# Patient Record
Sex: Female | Born: 1985 | Race: White | Hispanic: No | Marital: Single | Smoking: Former smoker
Health system: Southern US, Community
[De-identification: ages and names within clinical notes are randomized; demographics above are authoritative.]

## PROBLEM LIST (undated history)

## (undated) DIAGNOSIS — J45909 Unspecified asthma, uncomplicated: Secondary | ICD-10-CM

## (undated) DIAGNOSIS — Z91010 Allergy to peanuts: Secondary | ICD-10-CM

## (undated) DIAGNOSIS — G930 Cerebral cysts: Secondary | ICD-10-CM

## (undated) HISTORY — DX: Allergy to peanuts: Z91.010

## (undated) HISTORY — DX: Unspecified asthma, uncomplicated: J45.909

## (undated) HISTORY — DX: Cerebral cysts: G93.0

## (undated) HISTORY — PX: TONSILLECTOMY: SUR1361

## (undated) HISTORY — PX: APPENDECTOMY: SHX54

## (undated) HISTORY — PX: WISDOM TOOTH EXTRACTION: SHX21

---

## 2007-06-19 ENCOUNTER — Emergency Department (HOSPITAL_COMMUNITY): Admission: EM | Admit: 2007-06-19 | Discharge: 2007-06-19 | Payer: Self-pay | Admitting: Emergency Medicine

## 2008-01-13 IMAGING — CR DG SACRUM/COCCYX 2+V
3 series · 3 of 3 positions shown · non-contrast
Comparison: None.

CLINICAL DATA: 20-year-old female, status post trauma, fall.  Pain to the sacrum and coccyx.
 PELVIS ? 1 VIEW:

[t sacrum a.p.]
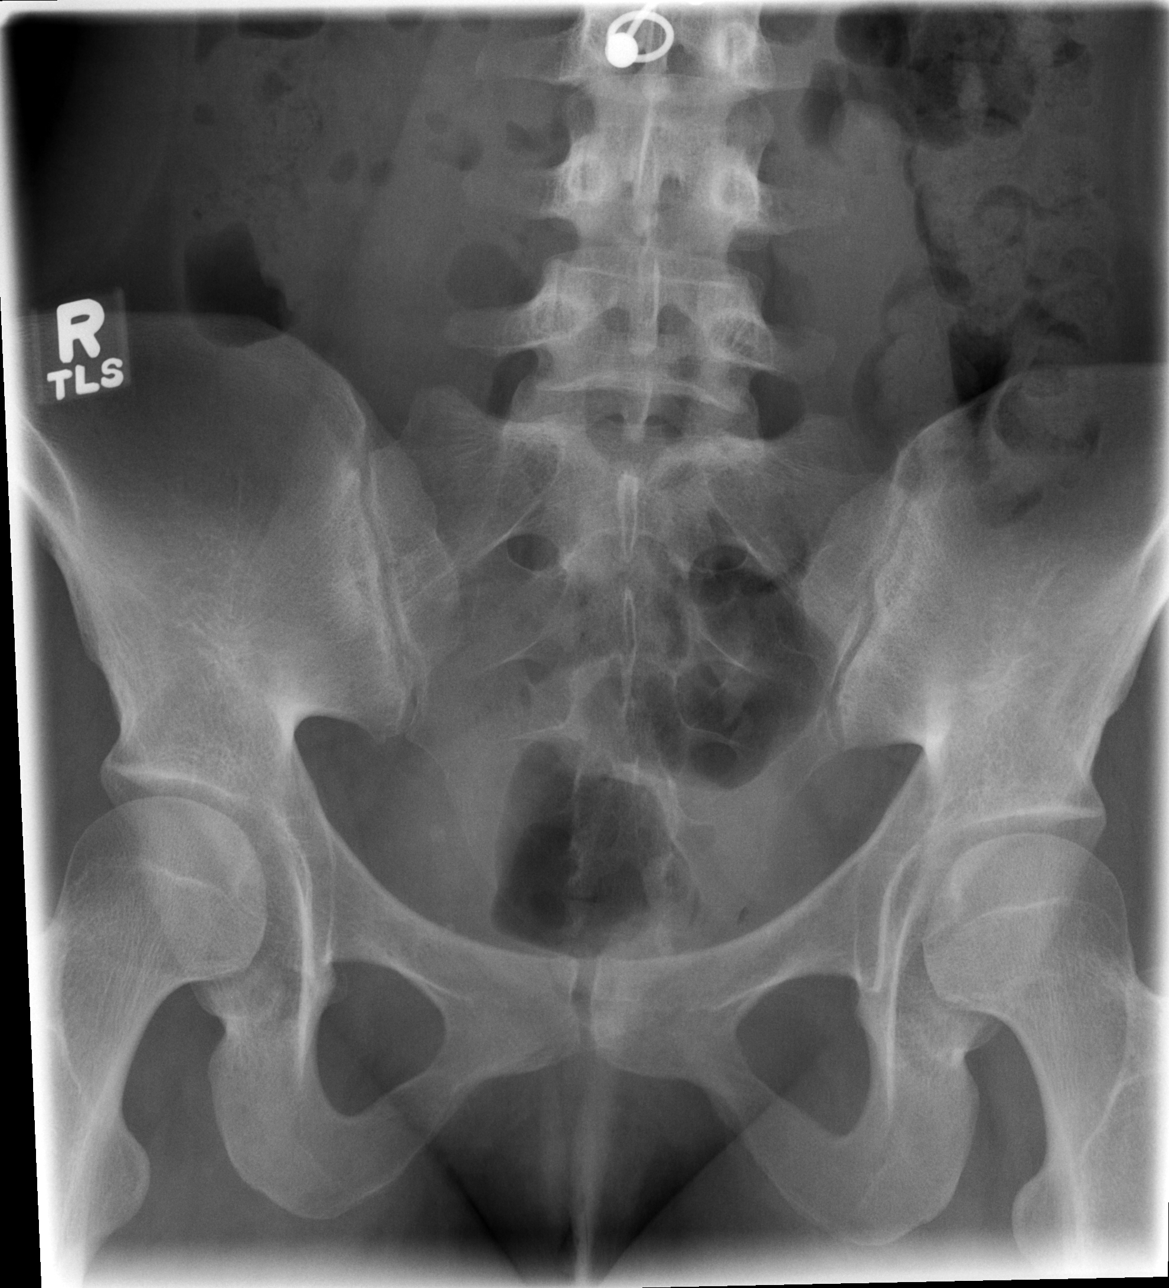

[t coccyx a.p.]
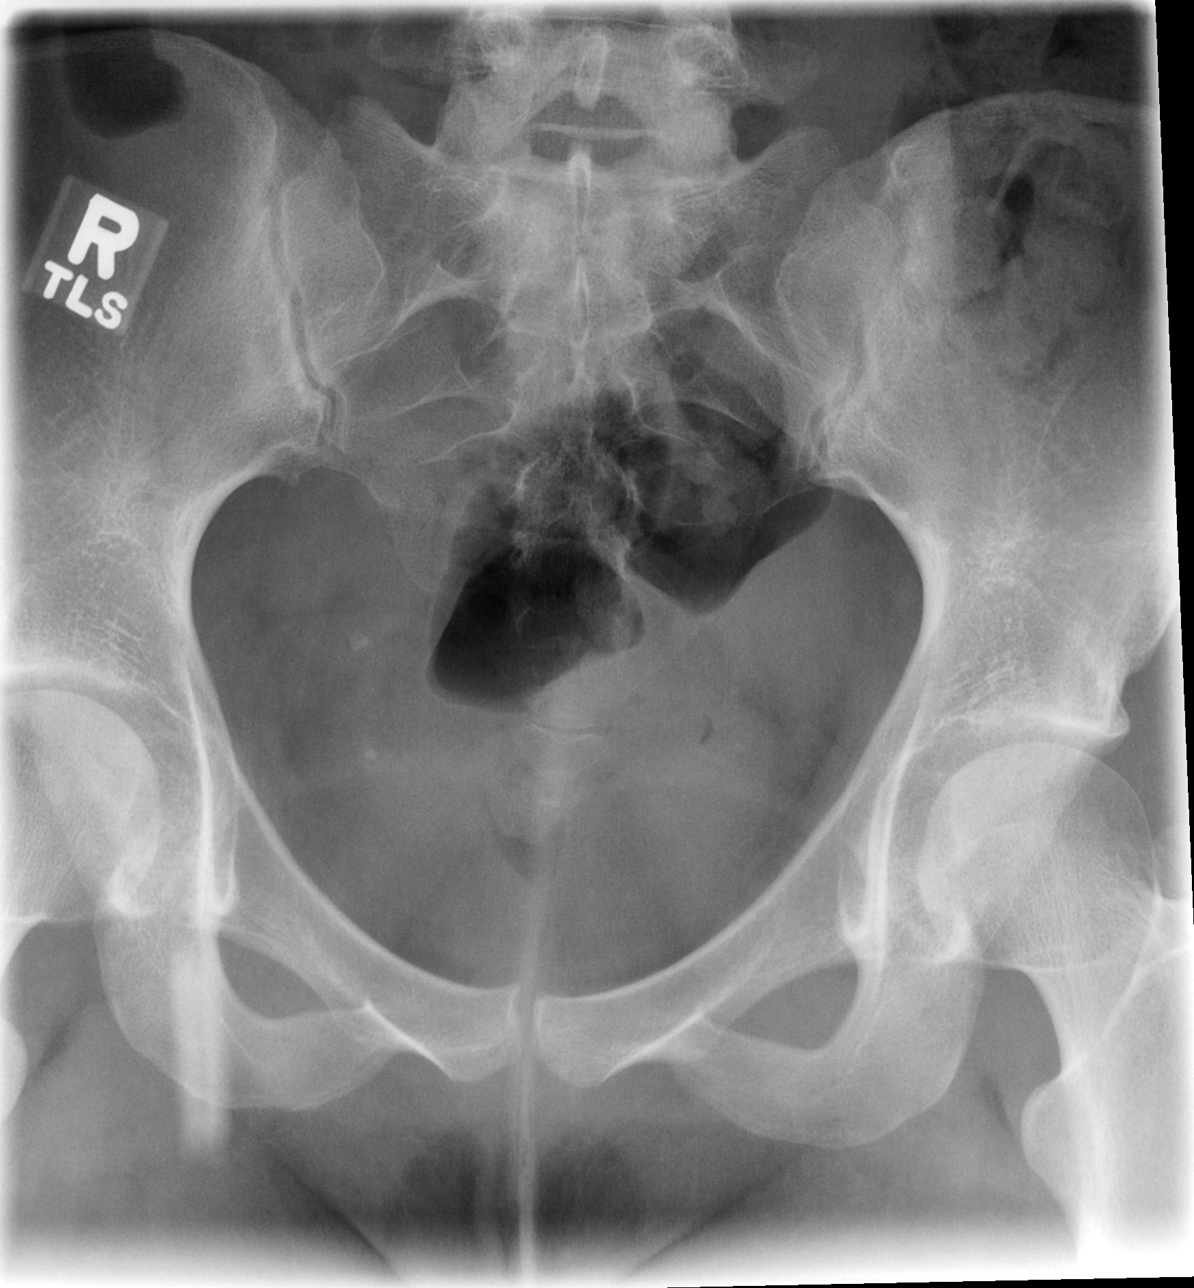

[t coccyx lat]
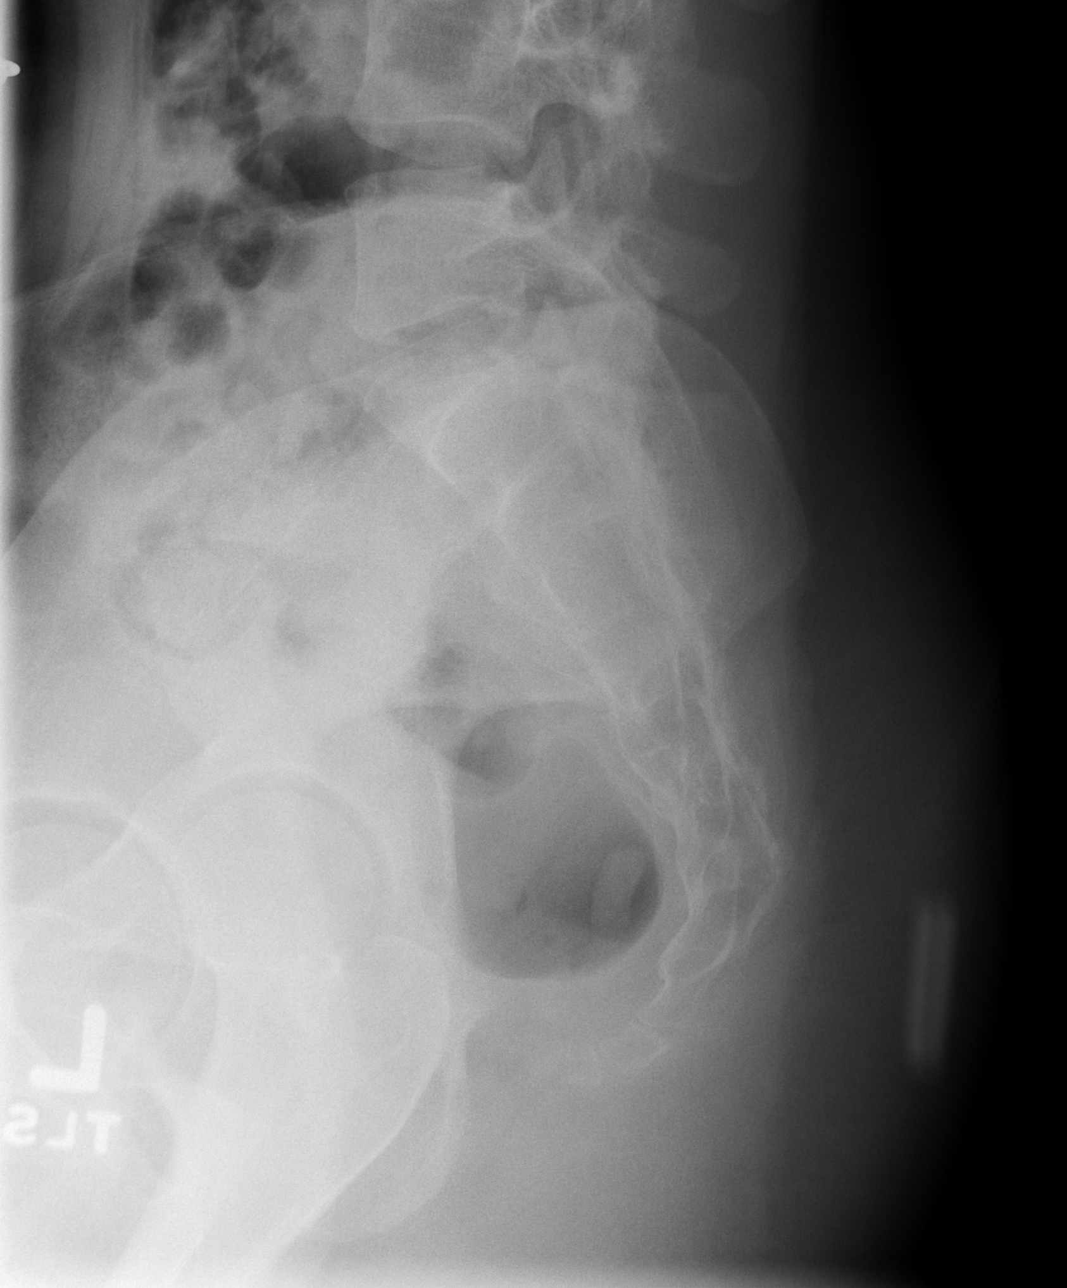

[3 of 3 positions shown; findings below may reference images not displayed]

FINDINGS: Femoral heads are normally located.  Normal bone mineralization about the pelvis.  Sacroiliac joints are normal.  Visualized lower lumbar spine is normal.  Sacral alae appear intact.  Gas and stool projects in the sigmoid colon and rectum, obscuring some bone detail.  Small vascular calcifications in the pelvis.  Nonobstructive bowel gas pattern.  No acute fracture or dislocation seen about the pelvis.
IMPRESSION: No acute fracture or dislocation seen about the pelvis.
 SACRUM/COCCYX SERIES:
FINDINGS: Normal sacroiliac joints.  The sacrum appears intact on AP views.  There is angulation of the inferior sacrum seen on the lateral view and only slight angulation of the coccyx.  This is felt to be within physiologic limits.  No cortical break is identified.
IMPRESSION: Angulation of the inferior sacrum appears within normal limits.  No definite acute fracture or dislocation seen.

## 2008-01-13 IMAGING — CR DG PELVIS 1-2V
1 series · 1 of 1 positions shown · non-contrast
Comparison: None.

CLINICAL DATA: 20-year-old female, status post trauma, fall.  Pain to the sacrum and coccyx.
 PELVIS ? 1 VIEW:

[t pelvis a.p.]
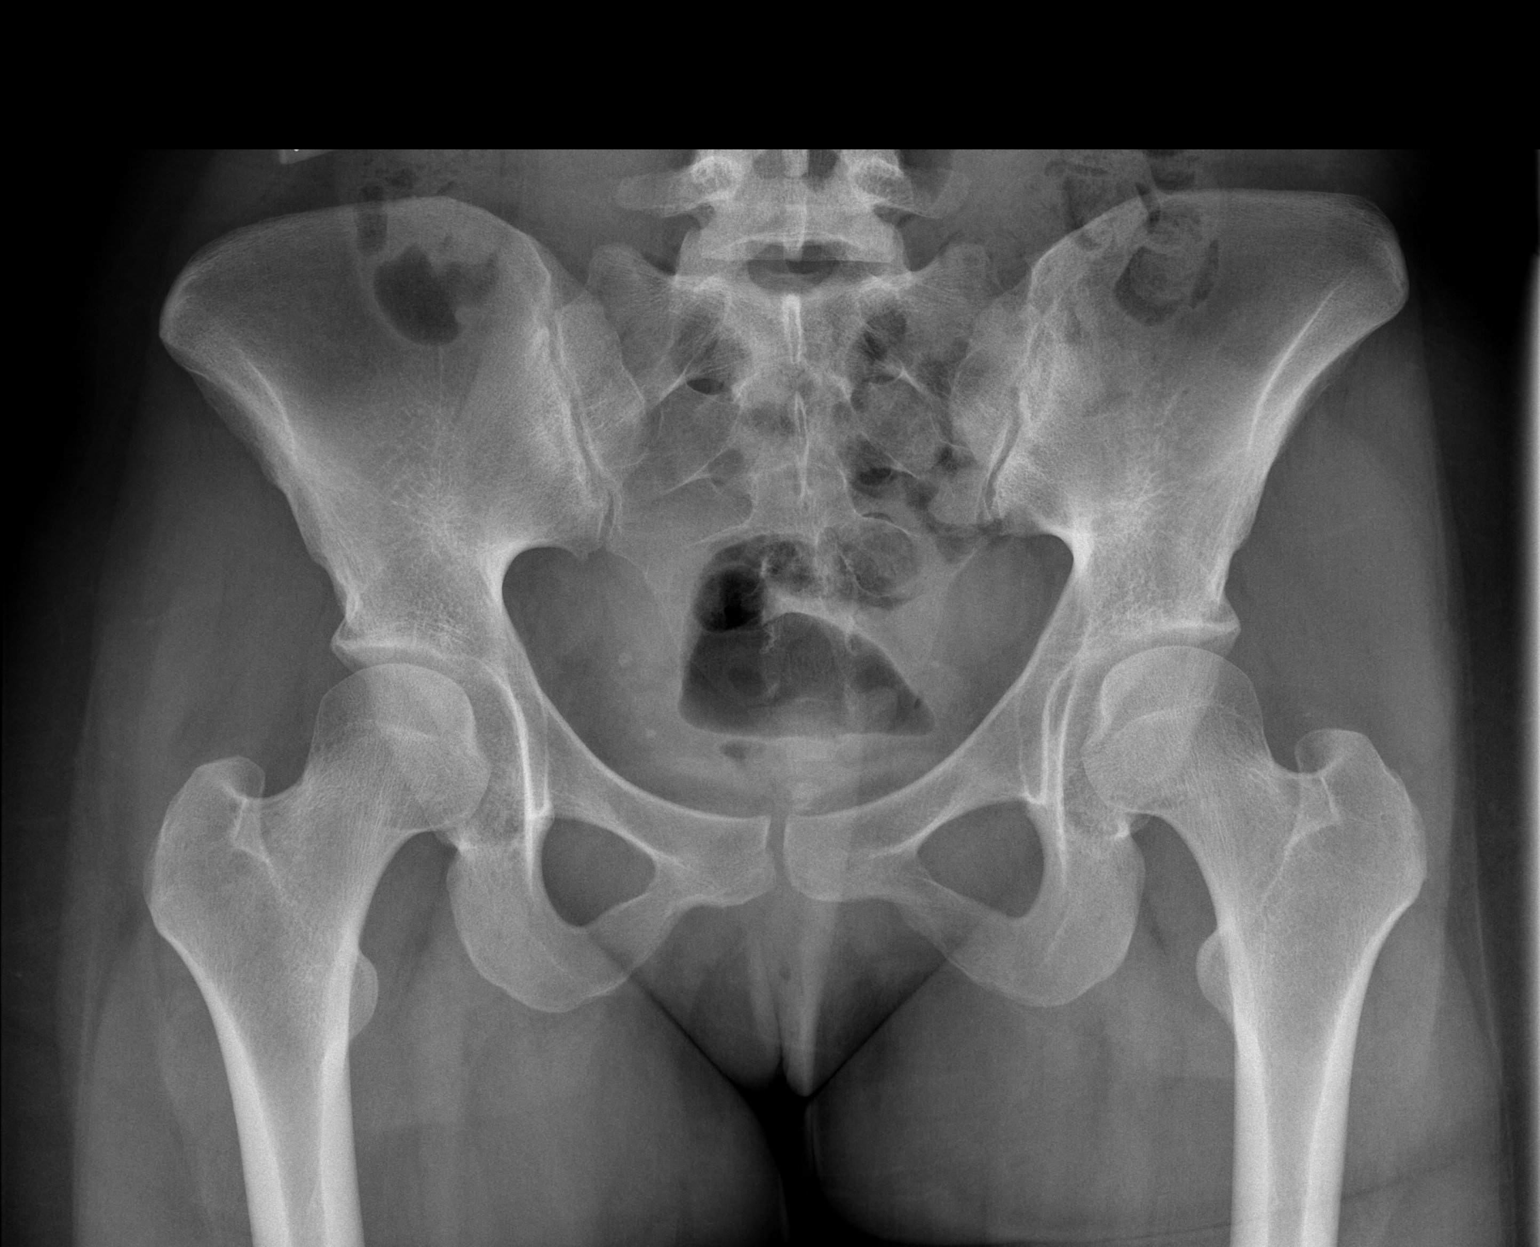

[1 of 1 positions shown; findings below may reference images not displayed]

FINDINGS: Femoral heads are normally located.  Normal bone mineralization about the pelvis.  Sacroiliac joints are normal.  Visualized lower lumbar spine is normal.  Sacral alae appear intact.  Gas and stool projects in the sigmoid colon and rectum, obscuring some bone detail.  Small vascular calcifications in the pelvis.  Nonobstructive bowel gas pattern.  No acute fracture or dislocation seen about the pelvis.
IMPRESSION: No acute fracture or dislocation seen about the pelvis.
 SACRUM/COCCYX SERIES:
FINDINGS: Normal sacroiliac joints.  The sacrum appears intact on AP views.  There is angulation of the inferior sacrum seen on the lateral view and only slight angulation of the coccyx.  This is felt to be within physiologic limits.  No cortical break is identified.
IMPRESSION: Angulation of the inferior sacrum appears within normal limits.  No definite acute fracture or dislocation seen.

## 2018-09-07 ENCOUNTER — Encounter: Payer: Self-pay | Admitting: *Deleted

## 2018-09-07 ENCOUNTER — Ambulatory Visit (INDEPENDENT_AMBULATORY_CARE_PROVIDER_SITE_OTHER): Payer: BC Managed Care – PPO

## 2018-09-07 ENCOUNTER — Other Ambulatory Visit: Payer: Self-pay

## 2018-09-07 ENCOUNTER — Ambulatory Visit: Payer: BC Managed Care – PPO | Admitting: Podiatry

## 2018-09-07 ENCOUNTER — Encounter: Payer: Self-pay | Admitting: Podiatry

## 2018-09-07 VITALS — BP 105/71 | HR 76 | Resp 16 | Ht 62.0 in | Wt 174.0 lb

## 2018-09-07 DIAGNOSIS — S93401A Sprain of unspecified ligament of right ankle, initial encounter: Secondary | ICD-10-CM | POA: Diagnosis not present

## 2018-09-07 DIAGNOSIS — S9031XA Contusion of right foot, initial encounter: Secondary | ICD-10-CM

## 2018-09-07 DIAGNOSIS — S82891A Other fracture of right lower leg, initial encounter for closed fracture: Secondary | ICD-10-CM | POA: Diagnosis not present

## 2018-09-07 DIAGNOSIS — R600 Localized edema: Secondary | ICD-10-CM | POA: Diagnosis not present

## 2018-09-07 DIAGNOSIS — M25571 Pain in right ankle and joints of right foot: Secondary | ICD-10-CM | POA: Diagnosis not present

## 2018-09-07 DIAGNOSIS — M25471 Effusion, right ankle: Secondary | ICD-10-CM

## 2018-09-07 NOTE — Progress Notes (Signed)
   Subjective:    Patient ID: Monica Conner, female    DOB: [birth year], 32 y.o.   MRN: [id number]  HPI    Review of Systems  Musculoskeletal: Positive for arthralgias, joint swelling and myalgias.  All other systems reviewed and are negative.      Objective:   Physical Exam        Assessment & Plan:

## 2018-09-07 NOTE — Progress Notes (Signed)
  Subjective:  Patient ID: Tilden Dome, female    DOB: 14-Jul-1986,  MRN: [id number]  Chief Complaint  Patient presents with  . Foot Injury    R ankle sprain (fell off a step and hear a pop) x Sept 27; 5/10 pain Pt. stated," I was seen at Urgent care and they said I only sprained it. Also, the pain is worst w/ bending and flexion Tx: ibuprofe, icing, elevation, and cam boot      32 y.o. female presents with the above complaint. Above history confirmed with patient.  Review of Systems: Negative except as noted in the HPI. Denies N/V/F/Ch.  No past medical history on file.  Current Outpatient Medications:  .  cyclobenzaprine (FLEXERIL) 10 MG tablet, TK 1 T PO TID FOR 10 DAYS PRN, Disp: , Rfl: 0 .  naproxen (NAPROSYN) 500 MG tablet, TK 1 T PO Q 12 H WF OR MILK PRN, Disp: , Rfl: 2 .  predniSONE (DELTASONE) 20 MG tablet, TK 1 T PO QD, Disp: , Rfl: 0  Social History   Tobacco Use  Smoking Status Never Smoker  Smokeless Tobacco Never Used    Allergies  Allergen Reactions  . Sulfa Antibiotics    Objective:   Vitals:   09/07/18 1446  BP: 105/71  Pulse: 76  Resp: 16   Body mass index is 31.83 kg/m. Constitutional Well developed. Well nourished.  Vascular Dorsalis pedis pulses palpable bilaterally. Posterior tibial pulses palpable bilaterally. Capillary refill normal to all digits.  No cyanosis or clubbing noted. Pedal hair growth normal.  Neurologic Normal speech. Oriented to person, place, and time. Epicritic sensation to light touch grossly present bilaterally.  Dermatologic Nails well groomed and normal in appearance. No open wounds. No skin lesions. Contusion noted right foot  Orthopedic: Normal joint ROM without pain or crepitus bilaterally. No visible deformities. POP R lateral malleolus summit POP R peroneal tendons POP R ATFL No POP R medial ankle about the deltoids.   Radiographs: Taken and reviewed. Fibular avulsion fracture noted. No other fractures  noted. Assessment:   1. Moderate right ankle sprain, initial encounter   2. Pain and swelling of right ankle   3. Closed avulsion fracture of right ankle, initial encounter   4. Contusion of right foot, initial encounter   5. Localized edema    Plan:  Patient was evaluated and treated and all questions answered.  R Fibular Avulsion Fracture -XR reviewed as above. Prior XR reviewed as well. -Would benefit from non-operative treatment of this fracture. -Discussed importance of using CAM boot -Limit activity, work for next 2 weeks. -Soft cast c/o Unna boot and Coban applied today.  Return for R ankle avulsion fracture f/u. New XR at that time.

## 2018-09-09 ENCOUNTER — Other Ambulatory Visit: Payer: Self-pay | Admitting: Podiatry

## 2018-09-09 DIAGNOSIS — M25471 Effusion, right ankle: Secondary | ICD-10-CM

## 2018-09-09 DIAGNOSIS — R600 Localized edema: Secondary | ICD-10-CM

## 2018-09-09 DIAGNOSIS — S93401A Sprain of unspecified ligament of right ankle, initial encounter: Secondary | ICD-10-CM

## 2018-09-09 DIAGNOSIS — M25571 Pain in right ankle and joints of right foot: Secondary | ICD-10-CM

## 2018-09-09 DIAGNOSIS — S82891A Other fracture of right lower leg, initial encounter for closed fracture: Secondary | ICD-10-CM

## 2018-09-09 DIAGNOSIS — S9031XA Contusion of right foot, initial encounter: Secondary | ICD-10-CM

## 2018-09-21 ENCOUNTER — Encounter: Payer: Self-pay | Admitting: *Deleted

## 2018-09-21 ENCOUNTER — Ambulatory Visit (INDEPENDENT_AMBULATORY_CARE_PROVIDER_SITE_OTHER): Payer: BC Managed Care – PPO

## 2018-09-21 ENCOUNTER — Ambulatory Visit (INDEPENDENT_AMBULATORY_CARE_PROVIDER_SITE_OTHER): Payer: BC Managed Care – PPO | Admitting: Podiatry

## 2018-09-21 DIAGNOSIS — M79672 Pain in left foot: Secondary | ICD-10-CM

## 2018-09-21 DIAGNOSIS — S9031XD Contusion of right foot, subsequent encounter: Secondary | ICD-10-CM | POA: Diagnosis not present

## 2018-09-21 DIAGNOSIS — M25571 Pain in right ankle and joints of right foot: Secondary | ICD-10-CM | POA: Diagnosis not present

## 2018-09-21 DIAGNOSIS — M25471 Effusion, right ankle: Secondary | ICD-10-CM

## 2018-09-21 DIAGNOSIS — S93401D Sprain of unspecified ligament of right ankle, subsequent encounter: Secondary | ICD-10-CM

## 2018-09-21 DIAGNOSIS — S82891D Other fracture of right lower leg, subsequent encounter for closed fracture with routine healing: Secondary | ICD-10-CM

## 2018-09-21 NOTE — Progress Notes (Signed)
  Subjective:  Patient ID: Monica Conner, female    DOB: 08-14-1986,  MRN: [id number]  Chief Complaint  Patient presents with  . Foot Injury    F/U R ankle sprain Pt. stated," hasn't been botherin me much, except when I bend it. I haven't tried to walk  on it much; 2/10." Tx: cam boot and ibuprofen    32 y.o. female presents with the above complaint. Above history confirmed with patient.  Review of Systems: Negative except as noted in the HPI. Denies N/V/F/Ch.  No past medical history on file.  Current Outpatient Medications:  .  cyclobenzaprine (FLEXERIL) 10 MG tablet, TK 1 T PO TID FOR 10 DAYS PRN, Disp: , Rfl: 0 .  naproxen (NAPROSYN) 500 MG tablet, TK 1 T PO Q 12 H WF OR MILK PRN, Disp: , Rfl: 2 .  predniSONE (DELTASONE) 20 MG tablet, TK 1 T PO QD, Disp: , Rfl: 0  Social History   Tobacco Use  Smoking Status Never Smoker  Smokeless Tobacco Never Used    Allergies  Allergen Reactions  . Sulfa Antibiotics    Objective:   There were no vitals filed for this visit. There is no height or weight on file to calculate BMI. Constitutional Well developed. Well nourished.  Vascular Dorsalis pedis pulses palpable bilaterally. Posterior tibial pulses palpable bilaterally. Capillary refill normal to all digits.  No cyanosis or clubbing noted. Pedal hair growth normal.  Neurologic Normal speech. Oriented to person, place, and time. Epicritic sensation to light touch grossly present bilaterally.  Dermatologic Nails well groomed and normal in appearance. No open wounds. No skin lesions. Contusion noted right foot  Orthopedic: Normal joint ROM without pain or crepitus bilaterally. No visible deformities. POP R lateral malleolus summit POP R peroneal tendons POP R ATFL  No POP R medial ankle about the deltoids.   Radiographs: Taken and reviewed.  No worsening fracture still residual avulsion fragments noted Assessment:   1. Moderate right ankle sprain, subsequent encounter     2. Closed avulsion fracture of right ankle with routine healing, subsequent encounter   3. Pain and swelling of right ankle   4. Contusion of right foot, subsequent encounter    Plan:  Patient was evaluated and treated and all questions answered.  R Fibular Avulsion Fracture -XR reviewed as above. -Transition to ankle brace.  Advised she can return to light-duty should she be allowed to wear a tennis shoe and her ankle brace. -Recommended aggressive physical therapy for better rehab due to patient's high activity job however financial constraints prevent her from going.  Patient agreed to go to one appointment for an evaluation to learn what exercises she should do at home  No follow-ups on file. New XR at that time.

## 2018-09-23 ENCOUNTER — Encounter: Payer: Self-pay | Admitting: *Deleted

## 2018-09-23 ENCOUNTER — Telehealth: Payer: Self-pay | Admitting: Podiatry

## 2018-09-23 DIAGNOSIS — S93401D Sprain of unspecified ligament of right ankle, subsequent encounter: Secondary | ICD-10-CM

## 2018-09-23 NOTE — Telephone Encounter (Signed)
Could you fax over the PT Order and last office notes to [facility]. Therapy for patient.  Fax# [phone]

## 2018-09-23 NOTE — Telephone Encounter (Signed)
Faxed orders to [facility] Physical therapy.

## 2018-09-29 ENCOUNTER — Other Ambulatory Visit: Payer: Self-pay | Admitting: Podiatry

## 2018-09-29 DIAGNOSIS — S93401D Sprain of unspecified ligament of right ankle, subsequent encounter: Secondary | ICD-10-CM

## 2018-09-29 DIAGNOSIS — S9031XD Contusion of right foot, subsequent encounter: Secondary | ICD-10-CM

## 2018-10-05 ENCOUNTER — Encounter: Payer: Self-pay | Admitting: *Deleted

## 2018-10-05 ENCOUNTER — Ambulatory Visit (INDEPENDENT_AMBULATORY_CARE_PROVIDER_SITE_OTHER): Payer: BC Managed Care – PPO

## 2018-10-05 ENCOUNTER — Ambulatory Visit (INDEPENDENT_AMBULATORY_CARE_PROVIDER_SITE_OTHER): Payer: BC Managed Care – PPO | Admitting: Podiatry

## 2018-10-05 DIAGNOSIS — M722 Plantar fascial fibromatosis: Secondary | ICD-10-CM

## 2018-10-05 DIAGNOSIS — M2141 Flat foot [pes planus] (acquired), right foot: Secondary | ICD-10-CM

## 2018-10-05 DIAGNOSIS — S93401D Sprain of unspecified ligament of right ankle, subsequent encounter: Secondary | ICD-10-CM | POA: Diagnosis not present

## 2018-10-05 DIAGNOSIS — M25571 Pain in right ankle and joints of right foot: Secondary | ICD-10-CM | POA: Diagnosis not present

## 2018-10-05 DIAGNOSIS — M2142 Flat foot [pes planus] (acquired), left foot: Secondary | ICD-10-CM

## 2018-10-05 DIAGNOSIS — S82891D Other fracture of right lower leg, subsequent encounter for closed fracture with routine healing: Secondary | ICD-10-CM

## 2018-10-05 DIAGNOSIS — M25471 Effusion, right ankle: Secondary | ICD-10-CM

## 2018-10-05 MED ORDER — CICLOPIROX 8 % EX SOLN: Freq: Every day | CUTANEOUS | 0 refills | Status: DC

## 2018-10-05 NOTE — Patient Instructions (Signed)

## 2018-10-06 ENCOUNTER — Other Ambulatory Visit: Payer: Self-pay | Admitting: Podiatry

## 2018-10-06 DIAGNOSIS — M25471 Effusion, right ankle: Secondary | ICD-10-CM

## 2018-10-06 DIAGNOSIS — S93401D Sprain of unspecified ligament of right ankle, subsequent encounter: Secondary | ICD-10-CM

## 2018-10-06 DIAGNOSIS — M25571 Pain in right ankle and joints of right foot: Secondary | ICD-10-CM

## 2018-10-06 DIAGNOSIS — M722 Plantar fascial fibromatosis: Secondary | ICD-10-CM

## 2018-10-06 DIAGNOSIS — S82891D Other fracture of right lower leg, subsequent encounter for closed fracture with routine healing: Secondary | ICD-10-CM

## 2018-10-08 ENCOUNTER — Telehealth: Payer: Self-pay | Admitting: *Deleted

## 2018-11-03 NOTE — Telephone Encounter (Signed)
Entered in error

## 2018-11-16 ENCOUNTER — Ambulatory Visit: Payer: BC Managed Care – PPO | Admitting: Podiatry

## 2018-11-30 ENCOUNTER — Ambulatory Visit: Payer: BC Managed Care – PPO | Admitting: Podiatry

## 2018-12-13 ENCOUNTER — Ambulatory Visit (INDEPENDENT_AMBULATORY_CARE_PROVIDER_SITE_OTHER): Payer: BC Managed Care – PPO

## 2018-12-13 ENCOUNTER — Telehealth: Payer: Self-pay | Admitting: *Deleted

## 2018-12-13 ENCOUNTER — Ambulatory Visit: Payer: BC Managed Care – PPO | Admitting: Podiatry

## 2018-12-13 DIAGNOSIS — M722 Plantar fascial fibromatosis: Secondary | ICD-10-CM

## 2018-12-13 DIAGNOSIS — S82891D Other fracture of right lower leg, subsequent encounter for closed fracture with routine healing: Secondary | ICD-10-CM | POA: Diagnosis not present

## 2018-12-13 DIAGNOSIS — S93401D Sprain of unspecified ligament of right ankle, subsequent encounter: Secondary | ICD-10-CM | POA: Diagnosis not present

## 2018-12-13 DIAGNOSIS — M25571 Pain in right ankle and joints of right foot: Secondary | ICD-10-CM | POA: Diagnosis not present

## 2018-12-13 DIAGNOSIS — M25471 Effusion, right ankle: Secondary | ICD-10-CM

## 2018-12-13 MED ORDER — NONFORMULARY OR COMPOUNDED ITEM: 11 refills | Status: DC

## 2018-12-13 NOTE — Telephone Encounter (Signed)
Dr. Samuella Cota states [facility] Antifungal Cream should be ordered. Orders faxed to [facility].

## 2018-12-13 NOTE — Telephone Encounter (Signed)
-----   Message from Park Liter, DPM sent at 12/13/2018  4:33 PM EST ----- I tried sending Penlac but I guess insurance didn't like it. Is there an alternative we can send her? [facility]?

## 2018-12-13 NOTE — Progress Notes (Signed)
  Subjective:  Patient ID: Monica Conner, female    DOB: September 29, 1986,  MRN: [id number]  Chief Complaint  Patient presents with  . Foot Injury    F/U R ankle fx Pt. states," lot better, just feel a little tight like a rubber band tight, but no pain just stiff.:" Tx: ROM    33 y.o. female presents with the above complaint. Above history confirmed with patient.  Review of Systems: Negative except as noted in the HPI. Denies N/V/F/Ch.  No past medical history on file.  Current Outpatient Medications:  .  ciclopirox (PENLAC) 8 % solution, Apply topically at bedtime. Apply over nail and surrounding skin. Apply daily over previous coat. Remove weekly with file or polish remover., Disp: 6.6 mL, Rfl: 0 .  cyclobenzaprine (FLEXERIL) 10 MG tablet, TK 1 T PO TID FOR 10 DAYS PRN, Disp: , Rfl: 0 .  naproxen (NAPROSYN) 500 MG tablet, TK 1 T PO Q 12 H WF OR MILK PRN, Disp: , Rfl: 2 .  predniSONE (DELTASONE) 20 MG tablet, TK 1 T PO QD, Disp: , Rfl: 0  Social History   Tobacco Use  Smoking Status Never Smoker  Smokeless Tobacco Never Used    Allergies  Allergen Reactions  . Peanut Oil   . Sulfa Antibiotics    Objective:   There were no vitals filed for this visit. There is no height or weight on file to calculate BMI. Constitutional Well developed. Well nourished.  Vascular Dorsalis pedis pulses palpable bilaterally. Posterior tibial pulses palpable bilaterally. Capillary refill normal to all digits.  No cyanosis or clubbing noted. Pedal hair growth normal.  Neurologic Normal speech. Oriented to person, place, and time. Epicritic sensation to light touch grossly present bilaterally.  Dermatologic Nails well groomed and normal in appearance. No open wounds. No skin lesions. Contusion noted right foot  Orthopedic: Normal joint ROM without pain or crepitus bilaterally. No visible deformities. Slight POP peroneal tendons retromalleolar area   Radiographs: Taken and reviewed. Fracture  appears healed.  Assessment:   1. Moderate right ankle sprain, subsequent encounter   2. Closed avulsion fracture of right ankle with routine healing, subsequent encounter   3. Plantar fasciitis   4. Pain and swelling of right ankle    Plan:  Patient was evaluated and treated and all questions answered.  R Fibular Avulsion Fracture -XR reviewed  -Doing well minimal pain on exam today  Plantar fasciitis -CMOs dispensed today.  Onychomycosis -Will look into Penlac alternative.  No follow-ups on file.

## 2018-12-26 NOTE — Progress Notes (Signed)
  Subjective:  Patient ID: Monica Conner, female    DOB: 1986/07/18,  MRN: [id number]  Chief Complaint  Patient presents with  . Foot Injury    F/U R ankel fx Pt. states," it's improved, I can stando on it and walk on it. It's still a little bit stiff  but it's better." Tx: ankle brace and ROM -pt did 1 session of PT    33 y.o. female presents with the above complaint. Above history confirmed with patient.  Review of Systems: Negative except as noted in the HPI. Denies N/V/F/Ch.  No past medical history on file.  Current Outpatient Medications:  .  cyclobenzaprine (FLEXERIL) 10 MG tablet, TK 1 T PO TID FOR 10 DAYS PRN, Disp: , Rfl: 0 .  naproxen (NAPROSYN) 500 MG tablet, TK 1 T PO Q 12 H WF OR MILK PRN, Disp: , Rfl: 2 .  predniSONE (DELTASONE) 20 MG tablet, TK 1 T PO QD, Disp: , Rfl: 0 .  ciclopirox (PENLAC) 8 % solution, Apply topically at bedtime. Apply over nail and surrounding skin. Apply daily over previous coat. Remove weekly with file or polish remover., Disp: 6.6 mL, Rfl: 0 .  NONFORMULARY OR COMPOUNDED ITEM, [facility]:  Antifungal Cream - Terbinafine 3%, Fluconazole 2%, Tea Tree Oil 5%, Urea 10%, Ibuprofen 2% in DMSO suspension 39ml. Apply to the affected Nail(s) once (at bedtime) or twice daily., Disp: 30 each, Rfl: 11  Social History   Tobacco Use  Smoking Status Never Smoker  Smokeless Tobacco Never Used    Allergies  Allergen Reactions  . Peanut Oil   . Sulfa Antibiotics    Objective:   There were no vitals filed for this visit. There is no height or weight on file to calculate BMI. Constitutional Well developed. Well nourished.  Vascular Dorsalis pedis pulses palpable bilaterally. Posterior tibial pulses palpable bilaterally. Capillary refill normal to all digits.  No cyanosis or clubbing noted. Pedal hair growth normal.  Neurologic Normal speech. Oriented to person, place, and time. Epicritic sensation to light touch grossly present bilaterally.    Dermatologic Nails well groomed and normal in appearance. No open wounds. No skin lesions. Contusion noted right foot  Orthopedic: Normal joint ROM without pain or crepitus bilaterally. No visible deformities. POP R lateral malleolus summit POP R peroneal tendons POP R ATFL  No POP R medial ankle about the deltoids.   Radiographs: Taken and reviewed.  No worsening fracture still residual avulsion fragments noted Assessment:   1. Moderate right ankle sprain, subsequent encounter   2. Closed avulsion fracture of right ankle with routine healing, subsequent encounter   3. Pain and swelling of right ankle   4. Plantar fasciitis   5. Pes planus of both feet    Plan:  Patient was evaluated and treated and all questions answered.  R Fibular Avulsion Fracture -Improving. Continue use of brace.   Plantar Fasciitis -Educated on stretching and icing.  Return in about 6 weeks (around 11/16/2018) for Fracture f/u, plantar fasciitis f/u.

## 2022-07-22 ENCOUNTER — Ambulatory Visit: Payer: BC Managed Care – PPO | Admitting: Allergy

## 2022-07-22 ENCOUNTER — Encounter: Payer: Self-pay | Admitting: Allergy

## 2022-07-22 VITALS — BP 118/72 | HR 84 | Resp 16 | Ht 61.5 in | Wt 163.4 lb

## 2022-07-22 DIAGNOSIS — T7800XA Anaphylactic reaction due to unspecified food, initial encounter: Secondary | ICD-10-CM | POA: Diagnosis not present

## 2022-07-22 DIAGNOSIS — J3089 Other allergic rhinitis: Secondary | ICD-10-CM | POA: Diagnosis not present

## 2022-07-22 DIAGNOSIS — T63441D Toxic effect of venom of bees, accidental (unintentional), subsequent encounter: Secondary | ICD-10-CM

## 2022-07-22 DIAGNOSIS — H1013 Acute atopic conjunctivitis, bilateral: Secondary | ICD-10-CM

## 2022-07-22 DIAGNOSIS — T7800XD Anaphylactic reaction due to unspecified food, subsequent encounter: Secondary | ICD-10-CM

## 2022-07-22 MED ORDER — EPINEPHRINE 0.3 MG/0.3ML IJ SOAJ: 0.3000 mg | INTRAMUSCULAR | 1 refills | Status: AC | PRN

## 2022-07-22 NOTE — Patient Instructions (Addendum)
Stinging insect reaction - your sting involved 2 organ systems with skin and gastrointestinal involvement.   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 - Copy of test results provided.  - Avoidance measures provided. - Continue with: Flonase (fluticasone) two sprays per nostril daily (AIM FOR EAR ON EACH SIDE) - Start taking as needed: Allegra (fexofenadine) 180mg  tablet once daily OR Zyrtec (cetirizine) 10mg  tablet once daily OR Xyzal (levocetirizine) 5mg  tablet once daily.  These are long-acting antihistamine better for day to day use than benadryl for allergy symptom control.   Pataday 1 drop each eye daily as needed for itchy/watery eyes.  These allergy medications are all over-the-counter options.  - You can use an extra dose of the antihistamine, if needed, for breakthrough symptoms.  - Consider nasal saline rinses 1-2 times daily to remove allergens from the nasal cavities as well as help with mucous clearance (this is especially helpful to do before the  nasal sprays are given) - Consider allergy shots as a means of long-term control if medication management is not effective - Allergy shots "re-train" and "reset" the immune system to ignore environmental allergens and decrease the resulting immune response to those allergens (sneezing, itchy watery eyes, runny nose, nasal congestion, etc).    - Allergy shots improve symptoms in 75-85% of patients.  - We can discuss more at a future appointment if the medications are not working for you.  Follow-up in 6 months or sooner if needed

## 2022-07-22 NOTE — Progress Notes (Signed)
New Patient Note  RE: Monica Conner MRN: [id number] DOB: 12/07/1985 Date of Office Visit: 07/22/2022  Primary care provider: Galvin Proffer, MD  Chief Complaint: food allergy, bee sting allergy and seasonal allergy  History of present illness: Monica Conner is a 36 y.o. female presenting today for evaluation of allergy (food, hymenoptera, environmental).    She states she got stung on her left lower leg about 3 months ago.  She states it swelled "really bad" and itched a lot and states it did become discolored and flaky.  She states that evening she did get an upset stomach with diarrhea.  She states she did take benadryl after the sting.  She also used benadryl cream.  She states she still can see the area where she was stung and it is still irritated.  She states she did get a prescription cream that starts with an M to apply to area.  She does not moisturize daily but will use coconut oil on occasion.  She states her in-laws have a farm so she will be potentially prone to more stings.  She states several years ago she was stung on her arm that swelled only and resolved in a week.  She states her PCP recommended she has testing done as her reactions to stings seems to be escalating.  She does not have an epinephrine device.    She found out she was allergic to peanuts about 3 years ago.  She states she was developing stomach cramps, vomiting and diarrhea.  Symptoms would start within 1-2 hours of ingestion.  She states she had food panel done by PCP that was positive to peanut and milk was slightly positive.   She states with milk sometimes she can have upset stomach and diarrhea but not all the time.  She states she loves cheese and eats yogurts and ice cream without issue.   She states started getting migraines around puberty.  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  However this was not related to her migraines.  She states after she cut out peanut products the  migraines seems to lessen.  She states currently may have 1 a year vs 3-4 monthly.    She also reports sneezing and itchy/watery eyes especially with weed eating.  Also reports congestion and uses flonase which does help. She states after this she will take benadryl and take a shower.  She has used visine eye drop sometimes.   She has childhood history of asthma but no issues in adulthood.   Review of systems in the past 4 weeks: Review of Systems  Constitutional: Negative.   HENT: Negative.    Eyes: Negative.   Respiratory: Negative.    Cardiovascular: Negative.   Gastrointestinal: Negative.   Musculoskeletal: Negative.   Skin: Negative.   Allergic/Immunologic: Negative.   Neurological: Negative.     All other systems negative unless noted above in HPI  Past medical history: Past Medical History:  Diagnosis Date   Asthma    childhood   Brain cyst    Peanut allergy     Past surgical history: Past Surgical History:  Procedure Laterality Date   APPENDECTOMY     TONSILLECTOMY     WISDOM TOOTH EXTRACTION      Family history:  Family History  Problem Relation Age of Onset   Allergic rhinitis Neg Hx    Angioedema Neg Hx    Asthma Neg Hx  Atopy Neg Hx    Eczema Neg Hx    Immunodeficiency Neg Hx    Urticaria Neg Hx     Social history: Lives in a home without carpeting with gas heating and central cooling.  Cat in the home.  Dogs and goats outside the home.  There is concern for mold/mildew in the home.  No concern for roaches in the home.  She is a Brewing technologist.  She reports smoking history since 36 years old.     Medication List: Current Outpatient Medications  Medication Sig Dispense Refill   fluticasone (FLONASE) 50 MCG/ACT nasal spray Place 1 spray into both nostrils daily.     ibuprofen (ADVIL) 200 MG tablet 1 tablet with food or milk as needed Orally Three times a day     phentermine (ADIPEX-P) 37.5 MG tablet Take 37.5 mg by mouth every morning.      No current facility-administered medications for this visit.    Known medication allergies: Allergies  Allergen Reactions   Peanut Oil    Sulfa Antibiotics      Physical examination: Blood pressure 118/72, pulse 84, resp. rate 16, height 5' 1.5" (1.562 m), weight 163 lb 6.4 oz (74.1 kg), SpO2 99 %.  General: Alert, interactive, in no acute distress. HEENT: PERRLA, TMs pearly gray, turbinates non-edematous without discharge, post-pharynx non erythematous. Neck: Supple without lymphadenopathy. Lungs: Clear to auscultation without wheezing, rhonchi or rales. {no increased work of breathing. CV: Normal S1, S2 without murmurs. Abdomen: Nondistended, nontender. Skin: Warm and dry, without lesions or rashes. Extremities:  No clubbing, cyanosis or edema. Neuro:   Grossly intact.  Diagnositics/Labs:  Allergy testing:   Airborne Adult Perc - 07/22/22 1032     Time Antigen Placed 1032    Allergen Manufacturer Waynette Buttery    Location Back    Number of Test 59    Panel 1 Select    1. Control-Buffer 50% Glycerol Negative    2. Control-Histamine 1 mg/ml 2+    3. Albumin saline Negative    4. Bahia 2+    5. [place] 2+    6. Johnson Negative    7. Kentucky Blue Negative    8. Meadow Fescue Negative    9. Perennial Rye 2+    10. Sweet Vernal Negative    11. Timothy Negative    12. Cocklebur Negative    13. Burweed Marshelder 2+    14. Ragweed, short Negative    15. Ragweed, Giant 2+    16. Plantain,  English Negative    17. Lamb's Quarters Negative    18. Sheep Sorrell 2+    19. Rough Pigweed Negative    20. Marsh Elder, Rough Negative    21. Mugwort, Common Negative    22. Ash mix Negative    23. Birch mix 2+    24. Beech American Negative    25. Box, Elder 2+    26. Cedar, red Negative    27. Cottonwood, Guinea[place] Negative    28. Elm mix Negative    29. Hickory 4+    30. Maple mix Negative    31. Oak, [place] mix Negative    32. Pecan Pollen 3+    33. Pine mix Negative     34. Sycamore Eastern Negative    35. Walnut, Black Pollen 2+    36. Alternaria alternata Negative    37. Cladosporium Herbarum Negative    38. Aspergillus mix Negative    39. Penicillium mix Negative  40. Bipolaris sorokiniana (Helminthosporium) Negative    41. Drechslera spicifera (Curvularia) Negative    42. Mucor plumbeus Negative    43. Fusarium moniliforme Negative    44. Aureobasidium pullulans (pullulara) Negative    45. Rhizopus oryzae Negative    46. Botrytis cinera Negative    47. Epicoccum nigrum Negative    48. Phoma betae Negative    49. Candida Albicans Negative    50. Trichophyton mentagrophytes Negative    51. Mite, D Farinae  5,000 AU/ml 4+    52. Mite, D Pteronyssinus  5,000 AU/ml 4+    53. Cat Hair 10,000 BAU/ml Negative    54.  Dog Epithelia Negative    55. Mixed Feathers Negative    56. Horse Epithelia Negative    57. Cockroach, German Negative    58. Mouse Negative    59. Tobacco Leaf Negative             Food Adult Perc - 07/22/22 1000     Time Antigen Placed 1032    Allergen Manufacturer Waynette Buttery    Location Back    Number of allergen test 1    1. Peanut Negative             Allergy testing results were read and interpreted by provider, documented by clinical staff.   Assessment and plan:   Stinging insect reaction - your sting involved 2 organ systems with skin and gastrointestinal involvement.   - will obtain stinging insect panel via blood work - do your best to prevent future stings - have access to self-injectable epinephrine (Epipen or AuviQ) 0.3mg  at all times - follow emergency action plan in case of allergic reaction - if testing above is positive and you have future stings with more severe type reaction then you would recommended and eligible for venom immunotherapy to decrease severity of future reactions upon stings  Food allergy - peanut testing via blood work reported positive - skin testing to peanut today is  negative - will obtain peanut IgE level and if negative or low then would be eligible for in-office food challenge to determine if you are no longer allergic at this time - continue avoidance of peanut product for now - have access to self-injectable epinephrine (Epipen or AuviQ) 0.3mg  at all times - follow emergency action plan in case of allergic reaction  Allergic rhinitis with conjunctivitis - Testing today showed: grasses, ragweed, weeds, trees, and dust mites. - Copy of test results provided.  - Avoidance measures provided. - Continue with: Flonase (fluticasone) two sprays per nostril daily (AIM FOR EAR ON EACH SIDE) - Start taking as needed: Allegra (fexofenadine) 180mg  tablet once daily OR Zyrtec (cetirizine) 10mg  tablet once daily OR Xyzal (levocetirizine) 5mg  tablet once daily.  These are long-acting antihistamine better for day to day use than benadryl for allergy symptom control.   Pataday 1 drop each eye daily as needed for itchy/watery eyes.  These allergy medications are all over-the-counter options.  - You can use an extra dose of the antihistamine, if needed, for breakthrough symptoms.  - Consider nasal saline rinses 1-2 times daily to remove allergens from the nasal cavities as well as help with mucous clearance (this is especially helpful to do before the nasal sprays are given) - Consider allergy shots as a means of long-term control if medication management is not effective - Allergy shots "re-train" and "reset" the immune system to ignore environmental allergens and decrease the resulting immune response to those allergens (sneezing,  itchy watery eyes, runny nose, nasal congestion, etc).    - Allergy shots improve symptoms in 75-85% of patients.  - We can discuss more at a future appointment if the medications are not working for you.  Follow-up in 6 months or sooner if needed  I appreciate the opportunity to take part in Taleyah's care. Please do not hesitate to contact me  with questions.  Sincerely,   Margo Aye, MD Allergy/Immunology [facility]

## 2022-07-25 LAB — ALLERGEN HYMENOPTERA PANEL

## 2022-07-25 LAB — IGE PEANUT W/COMPONENT REFLEX: Peanut, IgE: <0.1 kU/L

## 2022-07-26 LAB — ALLERGEN HYMENOPTERA PANEL
Bumblebee: <0.1 kU/L
Hornet, Yellow, IgE: <0.1 kU/L
Yellow Jacket, IgE: <0.1 kU/L

## 2022-10-28 ENCOUNTER — Encounter: Payer: BC Managed Care – PPO | Admitting: Allergy
